# Patient Record
Sex: Female | Born: 1950 | Race: Black or African American | Hispanic: No | Smoking: Never smoker
Health system: Southern US, Community
[De-identification: ages and names within clinical notes are randomized; demographics above are authoritative.]

## PROBLEM LIST (undated history)

## (undated) DIAGNOSIS — E079 Disorder of thyroid, unspecified: Secondary | ICD-10-CM

## (undated) HISTORY — PX: CHOLECYSTECTOMY: SHX55

---

## 2014-12-16 ENCOUNTER — Emergency Department (HOSPITAL_BASED_OUTPATIENT_CLINIC_OR_DEPARTMENT_OTHER)
Admission: EM | Admit: 2014-12-16 | Discharge: 2014-12-16 | Disposition: A | Discharge status: Home / Self Care | Payer: BLUE CROSS/BLUE SHIELD | Attending: Emergency Medicine | Admitting: Emergency Medicine

## 2014-12-16 ENCOUNTER — Encounter (HOSPITAL_BASED_OUTPATIENT_CLINIC_OR_DEPARTMENT_OTHER): Payer: Self-pay

## 2014-12-16 ENCOUNTER — Emergency Department (HOSPITAL_BASED_OUTPATIENT_CLINIC_OR_DEPARTMENT_OTHER): Payer: BLUE CROSS/BLUE SHIELD

## 2014-12-16 DIAGNOSIS — Y9289 Other specified places as the place of occurrence of the external cause: Secondary | ICD-10-CM | POA: Diagnosis not present

## 2014-12-16 DIAGNOSIS — Z79899 Other long term (current) drug therapy: Secondary | ICD-10-CM | POA: Diagnosis not present

## 2014-12-16 DIAGNOSIS — M25461 Effusion, right knee: Secondary | ICD-10-CM | POA: Diagnosis not present

## 2014-12-16 DIAGNOSIS — Y9389 Activity, other specified: Secondary | ICD-10-CM | POA: Insufficient documentation

## 2014-12-16 DIAGNOSIS — S8991XA Unspecified injury of right lower leg, initial encounter: Secondary | ICD-10-CM | POA: Diagnosis present

## 2014-12-16 DIAGNOSIS — Y998 Other external cause status: Secondary | ICD-10-CM | POA: Insufficient documentation

## 2014-12-16 DIAGNOSIS — E079 Disorder of thyroid, unspecified: Secondary | ICD-10-CM | POA: Diagnosis not present

## 2014-12-16 DIAGNOSIS — M25561 Pain in right knee: Secondary | ICD-10-CM

## 2014-12-16 DIAGNOSIS — M25469 Effusion, unspecified knee: Secondary | ICD-10-CM

## 2014-12-16 DIAGNOSIS — W01198A Fall on same level from slipping, tripping and stumbling with subsequent striking against other object, initial encounter: Secondary | ICD-10-CM | POA: Insufficient documentation

## 2014-12-16 HISTORY — DX: Disorder of thyroid, unspecified: E07.9

## 2014-12-16 NOTE — ED Provider Notes (Signed)
CSN: [id number]     Arrival date & time 12/16/14  1611 History   First MD Initiated Contact with Patient 12/16/14 1622     Chief Complaint  Patient presents with  . Knee Pain     (Consider location/radiation/quality/duration/timing/severity/associated sxs/prior Treatment) Patient is a 64 y.o. female presenting with knee pain. The history is provided by the patient.  Knee Pain Location:  Knee Time since incident:  3 days Injury: yes   Mechanism of injury: fall   Fall:    Fall occurred:  Standing   Impact surface:  Product manager of impact:  Knees   Entrapped after fall: no   Knee location:  R knee Pain details:    Quality:  Aching   Radiates to:  Does not radiate   Severity:  Mild   Onset quality:  Gradual   Duration:  3 days   Timing:  Constant   Progression:  Unchanged Chronicity:  New Dislocation: no   Foreign body present:  No foreign bodies Relieved by:  NSAIDs Worsened by:  Nothing tried Ineffective treatments:  None tried Associated symptoms: swelling   Associated symptoms: no muscle weakness, no numbness and no tingling     Past Medical History  Diagnosis Date  . Thyroid disease    Past Surgical History  Procedure Laterality Date  . Cesarean section    . Cholecystectomy     No family history on file. Social History  Substance Use Topics  . Smoking status: Never Smoker   . Smokeless tobacco: None  . Alcohol Use: No   OB History    No data available     Review of Systems  All other systems reviewed and are negative.     Allergies  Review of patient's allergies indicates no known allergies.  Home Medications   Prior to Admission medications   Medication Sig Start Date End Date Taking? Authorizing Provider  Levothyroxine Sodium (SYNTHROID PO) Take by mouth.   Yes Historical Provider, MD   BP 140/78 mmHg  Pulse 95  Temp(Src) 99 F (37.2 C) (Oral)  Resp 18  Ht 5\' 4"  (1.626 m)  Wt 88.905 kg  BMI 33.63 kg/m2 Physical Exam   Constitutional: She is oriented to person, place, and time. She appears well-developed and well-nourished.  HENT:  Head: Atraumatic.  Eyes: Conjunctivae are normal. No scleral icterus.  Neck: No tracheal deviation present.  Cardiovascular:  Pulses:      Posterior tibial pulses are 2+ on the right side, and 2+ on the left side.  Pulmonary/Chest: Effort normal. No respiratory distress.  Musculoskeletal: Normal range of motion. She exhibits edema and tenderness.       Right wrist: Normal.       Left wrist: Normal.       Right knee: She exhibits swelling and effusion. She exhibits normal range of motion, no ecchymosis, no deformity, no laceration, no erythema, normal alignment, no LCL laxity and normal patellar mobility. Tenderness found. Medial joint line tenderness noted. No lateral joint line tenderness noted.       Left knee: Normal.  Compartments are soft and compressible.  Neurological: She is alert and oriented to person, place, and time.  Strength and sensation intact bilaterally in lower extremities.   Skin: Skin is warm and dry.  Psychiatric: She has a normal mood and affect. Her behavior is normal.    ED Course  Procedures (including critical care time) Labs Review Labs Reviewed - No data to display  Imaging  Review Dg Knee Complete 4 Views Right  12/16/2014  CLINICAL DATA:  Pain following fall EXAM: RIGHT KNEE - COMPLETE 4+ VIEW COMPARISON:  None. FINDINGS: Frontal, lateral, and bilateral oblique views were obtained. There is no demonstrable fracture or dislocation. There is a sizable joint effusion. There is osteoarthritic change with moderate narrowing of the patellofemoral joint and milder narrowing in the medial and lateral compartments. There is spurring in all compartments. No erosive change. IMPRESSION: Sizable joint effusion. 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 There is spurring in all compartments. No acute fracture or  dislocation. Electronically Signed   By: Lowella Grip III M.D.   On: 12/16/2014 17:17   I have personally reviewed and evaluated these images and lab results as part of my medical decision-making.   EKG Interpretation None      MDM   Final diagnoses:  Joint effusion of knee  Right knee pain    Patient presents with right knee pain after a fall from standing on Sunday.  No LOC or head injury.  VSS, NAD.  No numbness, tingling, or weakness.  On exam, patient ambulatory without difficulty.  Strength and sensation intact.  Intact distal pulses.  FAROM.  Will obtain plain films.  Plain films show evidence of joint effusion, osteoarthritic change with narrowing in patellofemoral joint region.  Spurring in all compartments.  No acute fracture or dislocation.  Evaluation does not show pathology requring ongoing emergent intervention or admission. Pt is hemodynamically stable and mentating appropriately. Discussed findings/results and plan with patient/guardian, who agrees with plan. All questions answered. Return precautions discussed and outpatient follow up given.      Gloriann Loan, PA-C 12/16/14 1806

## 2014-12-16 NOTE — Discharge Instructions (Signed)
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
## 2014-12-25 ENCOUNTER — Emergency Department (HOSPITAL_BASED_OUTPATIENT_CLINIC_OR_DEPARTMENT_OTHER): Payer: BLUE CROSS/BLUE SHIELD

## 2014-12-25 ENCOUNTER — Encounter (HOSPITAL_BASED_OUTPATIENT_CLINIC_OR_DEPARTMENT_OTHER): Payer: Self-pay | Admitting: *Deleted

## 2014-12-25 ENCOUNTER — Emergency Department (HOSPITAL_BASED_OUTPATIENT_CLINIC_OR_DEPARTMENT_OTHER)
Admission: EM | Admit: 2014-12-25 | Discharge: 2014-12-25 | Disposition: A | Discharge status: Home / Self Care | Payer: BLUE CROSS/BLUE SHIELD | Attending: Emergency Medicine | Admitting: Emergency Medicine

## 2014-12-25 DIAGNOSIS — S8991XA Unspecified injury of right lower leg, initial encounter: Secondary | ICD-10-CM | POA: Diagnosis not present

## 2014-12-25 DIAGNOSIS — S60012A Contusion of left thumb without damage to nail, initial encounter: Secondary | ICD-10-CM | POA: Insufficient documentation

## 2014-12-25 DIAGNOSIS — Y9389 Activity, other specified: Secondary | ICD-10-CM | POA: Insufficient documentation

## 2014-12-25 DIAGNOSIS — M79645 Pain in left finger(s): Secondary | ICD-10-CM

## 2014-12-25 DIAGNOSIS — S60011A Contusion of right thumb without damage to nail, initial encounter: Secondary | ICD-10-CM | POA: Diagnosis not present

## 2014-12-25 DIAGNOSIS — S8991XD Unspecified injury of right lower leg, subsequent encounter: Secondary | ICD-10-CM | POA: Diagnosis not present

## 2014-12-25 DIAGNOSIS — Y92481 Parking lot as the place of occurrence of the external cause: Secondary | ICD-10-CM | POA: Insufficient documentation

## 2014-12-25 DIAGNOSIS — E079 Disorder of thyroid, unspecified: Secondary | ICD-10-CM | POA: Insufficient documentation

## 2014-12-25 DIAGNOSIS — M79644 Pain in right finger(s): Secondary | ICD-10-CM

## 2014-12-25 DIAGNOSIS — Y998 Other external cause status: Secondary | ICD-10-CM | POA: Diagnosis not present

## 2014-12-25 DIAGNOSIS — M25561 Pain in right knee: Secondary | ICD-10-CM

## 2014-12-25 MED ORDER — TRAMADOL HCL 50 MG PO TABS: 50.0000 mg | ORAL_TABLET | Freq: Four times a day (QID) | ORAL | Status: AC | PRN

## 2014-12-25 NOTE — Discharge Instructions (Signed)
Take the prescribed medication as directed.  You may continue taking aleve/tylenol/motrin as needed. May want to ice and elevate knee at home to help with pain/swelling. Follow-up with a primary care physician in the area-- see resource guide to help with this. You may also follow-up with an orthopedist in the area-- i have given you 2 contacts for physicians or you may find your own. Return to the ED for new or worsening symptoms.   Emergency Department Resource Guide 1) Find a Doctor and Pay Out of Pocket Although you won't have to find out who is covered by your insurance plan, it is a good idea to ask around and get recommendations. You will then need to call the office and see if the doctor you have chosen will accept you as a new patient and what types of options they offer for patients who are self-pay. Some doctors offer discounts or will set up payment plans for their patients who do not have insurance, but you will need to ask so you aren't surprised when you get to your appointment.  2) Contact Your Local Health Department Not all health departments have doctors that can see patients for sick visits, but many do, so it is worth a call to see if yours does. If you don't know where your local health department is, you can check in your phone book. The CDC also has a tool to help you locate your state's health department, and many state websites also have listings of all of their local health departments.  3) Find a [facility] If your illness is not likely to be very severe or complicated, you may want to try a walk in clinic. These are popping up all over the country in pharmacies, drugstores, and shopping centers. They're usually staffed by nurse practitioners or physician assistants that have been trained to treat common illnesses and complaints. They're usually fairly quick and inexpensive. However, if you have serious medical issues or chronic medical problems, these are probably not  your best option.  No Primary Care Doctor: - Call Health Connect at  [phone] - they can help you locate a primary care doctor that  accepts your insurance, provides certain services, etc. - Physician Referral Service- [phone]  Chronic Pain Problems: Organization         Address  Phone   Notes  [facility]  [phone] Patients need to be referred by their primary care doctor.   Medication Assistance: Organization         Address  Phone   Notes  [facility] Medication [facility] [address] [place], [place] [address] [phone] --Must be a resident of [facility] -- Must have NO insurance coverage whatsoever (no Medicaid/ Medicare, etc.) -- The pt. MUST have a primary care doctor that directs their care regularly and follows them in the community   MedAssist  [phone]   [employer]  [phone]    Agencies that provide inexpensive medical care: Organization         Address  Phone   Notes  [place]  [phone]   [facility]    [phone]   [facility] [place], [place] [address] [phone]   [place] [address] [place] [phone]   Planned Parenthood    [phone]   [facility]    [phone]  Community Health and [place]  [address], [place] Phone:  [phone], Fax:  [phone] Hours of Operation:  9 am - 6 pm, M-F.  Also accepts Medicaid/Medicare and self-pay.  [facility] [place] [address], [address], [place] Phone: [phone], Fax: [phone]. Hours of Operation:  8:30 am - 5:30 pm, M-F.  Also accepts Medicaid and self-pay.  [facility] [place] [address], [place] Phone: [phone]   [place], [place], [place] [phone], Ext. [phone] Mondays & Thursdays: 7-9 AM.  First  15 patients are seen on a first come, first serve basis.    [place] Providers:  Organization         Address  Phone   Notes  [facility] [address], [place] [phone] Also accepts self-pay patients.  [facility] [id number] [place], [place]  [phone]   [place], [address], [place] [phone]   [facility] [address] [place] [phone]   Lucianne Lei [address], [place]   [phone] Only accepts [place] Access [place] patients after they have their name applied to their card.   Self-Pay (no insurance) in [facility]:  Organization         Address  Phone   Notes  Sickle Cell Patients, [facility] Internal Medicine [place] [phone]   [facility] [place] [phone]   [facility] [place]  [place], [place], [place] [phone]   [facility]/Dr. Osei-Bonsu  [address], [place] or [address], [place] [phone] Phone number for both [place] and [place] locations is the same.  Urgent Medical and [facility] [address], [place] [phone]   [facility] [address] [place] or [address] [phone]   [facility] [address], [place] [phone], phone; [phone], fax Sees patients 1st and 3rd Saturday of every month.  Must not qualify for public or private insurance (i.e. Medicaid, Medicare, [place] Health Choice, Veterans' Benefits)  Household income should be no more than 200% of the poverty level The clinic cannot treat you if you are pregnant or think you are pregnant  Sexually transmitted diseases are not treated at the clinic.    Dental  Care: Organization         Address  Phone  Notes  [facility] of [facility] [place] [phone] Accepts children up to age 70 who are enrolled in [place] or [place]; pregnant women with a Medicaid card; and children who have applied for Medicaid or [place] Health Choice, but were declined, whose parents can pay a reduced fee at time of service.  [facility] Department of [facility]  [address], [place] [phone] Accepts children up to age 69 who are enrolled in [place] or [place]; pregnant women with a Medicaid card; and children who have applied for Medicaid or [place] Health Choice, but were declined, whose parents can pay a reduced fee at time of service.  [place]  [phone]  [place] [phone] Patients are seen by appointment only. Walk-ins are not accepted. [place] will see patients 26 years of age and older. Monday - Tuesday (8am-5pm) Most Wednesdays (8:30-5pm) $30 per visit, cash only  [facility] Adult Dental Access PROGRAM  [address], [facility] [phone] Patients are seen by appointment only. Walk-ins are not accepted. [place] will see patients 62 years of age and older. One Wednesday Evening (Monthly: Volunteer Based).  $30 per visit, cash only  [place]  [phone] for adults; Children under age 39, call Graduate Pediatric Dentistry at [phone]. Children aged 75-14, please call [phone] to request a pediatric application.  Dental services are provided in all areas of dental care including fillings, crowns and bridges, complete and partial dentures, implants, gum treatment, root canals, and extractions. Preventive care is also provided. Treatment is provided to both adults and children. Patients are selected via a lottery and there is often a waiting list.   [facility] [address] [place]  [phone] [email or web address]   [facility] [address] [place], [place] [phone], Ext. [phone] Second and Fourth Thursday of each month, opens at 6:30 AM; Clinic ends at 9 AM.  Patients are seen on a first-come first-served basis, and a limited number are seen during each clinic.   [facility]  [address] Hillard Danker [place], [place] [phone]   Eligibility Requirements You must have lived in [place], [place], or [place] counties for at least the last three months.   You cannot be eligible for state or federal sponsored [employer], including [employer], [place], or [employer].   You generally cannot be eligible for healthcare insurance through your employer.    How to apply: Eligibility screenings are held every Tuesday and Wednesday afternoon from 1:00 pm until 4:00 pm. You do not need an appointment for the interview!  [facility] [address] [place], [place]   [place]  [place] Department  [place]  [phone]    Behavioral Health Resources in the Community: Intensive Outpatient Programs Organization         Address  Phone  Notes  [place]. [address] [place], [place] [phone]   [facility]patient [address] [place], [place]   ADS: Alcohol & Drug Svcs [address], [place], [place]   [place] [address]  [place], [place] or [phone]   Substance Abuse Resources Organization         Address  Phone  Notes  Alcohol and Drug Services  [phone]   [place]  [phone]   The [place]   Chinita Pester  [phone]   Residential & Outpatient Substance Abuse Program  [phone]    Psychological Services Organization         Address  Phone  Notes  [facility] [place]  [place]  [phone]   [place] [address] [place] or [phone]    Mobile Crisis Teams Organization         Address  Phone  Notes  [facility]rapeutic Alternatives, Mobile Crisis Care Unit  [phone]   Assertive Psychotherapeutic Services  [address] [place], [place]  [facility] [place] [address], [address] [place] [phone]    Self-Help/Support Groups Organization         Address  Phone             Notes  Mental Health Assoc. of [place] - variety of support groups  Jessie Call for more information  Narcotics Anonymous (NA), [facility]aring [facility] [address], [employer]  2 meetings at this location   Special educational needs teacher         Address  Phone  Notes  ASAP Residential Treatment [place],    [place]  1-3181652474   [facility]  [address], [place] [id number], [place], [place]   [place], [place] [phone] Admissions: 8am-3pm M-F  Incentives Substance [place] [address]    [place], [place] [phone]   [facility] [address] [place], [place], [place]   [facility] [address]  [place], [place]   Insight Programs - Intensive Outpatient [address] Kristeen [address], [place], [place]   [facility] [place].,  [place], [place] [phone] or [phone]   [facility]idential Treatment Services (RTS) [address] [place], [place] Accepts Medicaid  [facility] [place] [address]  [place] [phone] Substance Abuse/Addiction Treatment   [facility] Organization         Address  Phone  Notes  [facility]  [phone]   Domenic Schwab, PhD [address] [place], [place]   [phone] or [phone]   [place], [place] [phone]   [facility] [address] [place], [place] [phone] Insurance/Medicaid/sponsorship through [facility] and Families [address], [address] [place]                                    [place], [place] [phone] [place] [address], [place] [phone]    Dr. Adele Schilder  [phone]   [facility] Dept. 1) [address], [place] 2) [place] 3)  [address], [place] [phone]  [phone]   [place] [phone] or [phone] (After Hours)

## 2014-12-25 NOTE — ED Notes (Signed)
Per pt report [place], given knee immobilizer on 12/16/14, took aleve with no reduction in pain. Also noticed bruising on hands.

## 2014-12-25 NOTE — ED Notes (Signed)
Pt. Is able to use her hands and her thumbs bilat. With no difficulty and Pt. Drove herself to [place] ED.
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
## 2014-12-30 NOTE — ED Provider Notes (Signed)
Medical screening examination/treatment/procedure(s) were performed by non-physician practitioner and as supervising physician I was immediately available for consultation/collaboration.   EKG Interpretation None        Fredia Sorrow, MD 12/30/14 [phone]
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
## 2018-01-22 DIAGNOSIS — J4520 Mild intermittent asthma, uncomplicated: Secondary | ICD-10-CM | POA: Diagnosis not present

## 2018-02-05 DIAGNOSIS — B9789 Other viral agents as the cause of diseases classified elsewhere: Secondary | ICD-10-CM | POA: Diagnosis not present

## 2018-02-05 DIAGNOSIS — J069 Acute upper respiratory infection, unspecified: Secondary | ICD-10-CM | POA: Diagnosis not present

## 2018-02-05 DIAGNOSIS — R05 Cough: Secondary | ICD-10-CM | POA: Diagnosis not present

## 2018-02-08 DIAGNOSIS — J4531 Mild persistent asthma with (acute) exacerbation: Secondary | ICD-10-CM | POA: Diagnosis not present

## 2018-02-22 DIAGNOSIS — J4520 Mild intermittent asthma, uncomplicated: Secondary | ICD-10-CM | POA: Diagnosis not present

## 2018-03-23 DIAGNOSIS — J4520 Mild intermittent asthma, uncomplicated: Secondary | ICD-10-CM | POA: Diagnosis not present

## 2018-04-15 DIAGNOSIS — J301 Allergic rhinitis due to pollen: Secondary | ICD-10-CM | POA: Diagnosis not present

## 2018-04-15 DIAGNOSIS — J4520 Mild intermittent asthma, uncomplicated: Secondary | ICD-10-CM | POA: Diagnosis not present

## 2018-04-23 DIAGNOSIS — J4520 Mild intermittent asthma, uncomplicated: Secondary | ICD-10-CM | POA: Diagnosis not present

## 2018-05-21 DIAGNOSIS — J45909 Unspecified asthma, uncomplicated: Secondary | ICD-10-CM | POA: Diagnosis not present

## 2018-05-21 DIAGNOSIS — Z8249 Family history of ischemic heart disease and other diseases of the circulatory system: Secondary | ICD-10-CM | POA: Diagnosis not present

## 2018-05-21 DIAGNOSIS — E039 Hypothyroidism, unspecified: Secondary | ICD-10-CM | POA: Diagnosis not present

## 2018-05-21 DIAGNOSIS — Z809 Family history of malignant neoplasm, unspecified: Secondary | ICD-10-CM | POA: Diagnosis not present

## 2018-05-21 DIAGNOSIS — Z7951 Long term (current) use of inhaled steroids: Secondary | ICD-10-CM | POA: Diagnosis not present

## 2018-05-21 DIAGNOSIS — Z841 Family history of disorders of kidney and ureter: Secondary | ICD-10-CM | POA: Diagnosis not present

## 2018-05-21 DIAGNOSIS — M170 Bilateral primary osteoarthritis of knee: Secondary | ICD-10-CM | POA: Diagnosis not present

## 2018-05-23 DIAGNOSIS — J4520 Mild intermittent asthma, uncomplicated: Secondary | ICD-10-CM | POA: Diagnosis not present

## 2018-06-10 DIAGNOSIS — J4520 Mild intermittent asthma, uncomplicated: Secondary | ICD-10-CM | POA: Diagnosis not present

## 2018-06-13 DIAGNOSIS — Z1231 Encounter for screening mammogram for malignant neoplasm of breast: Secondary | ICD-10-CM | POA: Diagnosis not present

## 2018-06-23 DIAGNOSIS — J4520 Mild intermittent asthma, uncomplicated: Secondary | ICD-10-CM | POA: Diagnosis not present

## 2018-07-05 DIAGNOSIS — Z719 Counseling, unspecified: Secondary | ICD-10-CM | POA: Diagnosis not present

## 2018-07-11 DIAGNOSIS — R05 Cough: Secondary | ICD-10-CM | POA: Diagnosis not present

## 2018-07-11 DIAGNOSIS — J4531 Mild persistent asthma with (acute) exacerbation: Secondary | ICD-10-CM | POA: Diagnosis not present

## 2018-07-23 DIAGNOSIS — J4520 Mild intermittent asthma, uncomplicated: Secondary | ICD-10-CM | POA: Diagnosis not present

## 2018-08-23 DIAGNOSIS — J4520 Mild intermittent asthma, uncomplicated: Secondary | ICD-10-CM | POA: Diagnosis not present

## 2018-09-13 DIAGNOSIS — E559 Vitamin D deficiency, unspecified: Secondary | ICD-10-CM | POA: Diagnosis not present

## 2018-09-13 DIAGNOSIS — J4530 Mild persistent asthma, uncomplicated: Secondary | ICD-10-CM | POA: Diagnosis not present

## 2018-09-13 DIAGNOSIS — R05 Cough: Secondary | ICD-10-CM | POA: Diagnosis not present

## 2018-09-13 DIAGNOSIS — Z131 Encounter for screening for diabetes mellitus: Secondary | ICD-10-CM | POA: Diagnosis not present

## 2018-09-13 DIAGNOSIS — E669 Obesity, unspecified: Secondary | ICD-10-CM | POA: Diagnosis not present

## 2018-09-13 DIAGNOSIS — E7800 Pure hypercholesterolemia, unspecified: Secondary | ICD-10-CM | POA: Diagnosis not present

## 2018-09-13 DIAGNOSIS — Z0001 Encounter for general adult medical examination with abnormal findings: Secondary | ICD-10-CM | POA: Diagnosis not present

## 2018-09-13 DIAGNOSIS — E039 Hypothyroidism, unspecified: Secondary | ICD-10-CM | POA: Diagnosis not present

## 2018-09-23 DIAGNOSIS — J4520 Mild intermittent asthma, uncomplicated: Secondary | ICD-10-CM | POA: Diagnosis not present

## 2018-09-27 DIAGNOSIS — E1165 Type 2 diabetes mellitus with hyperglycemia: Secondary | ICD-10-CM | POA: Diagnosis not present

## 2018-09-27 DIAGNOSIS — E669 Obesity, unspecified: Secondary | ICD-10-CM | POA: Diagnosis not present

## 2018-09-27 DIAGNOSIS — E559 Vitamin D deficiency, unspecified: Secondary | ICD-10-CM | POA: Diagnosis not present

## 2018-09-27 DIAGNOSIS — J4530 Mild persistent asthma, uncomplicated: Secondary | ICD-10-CM | POA: Diagnosis not present

## 2018-09-27 DIAGNOSIS — E039 Hypothyroidism, unspecified: Secondary | ICD-10-CM | POA: Diagnosis not present

## 2018-09-27 DIAGNOSIS — E7800 Pure hypercholesterolemia, unspecified: Secondary | ICD-10-CM | POA: Diagnosis not present

## 2018-10-03 DIAGNOSIS — R69 Illness, unspecified: Secondary | ICD-10-CM | POA: Diagnosis not present

## 2018-10-23 DIAGNOSIS — J4520 Mild intermittent asthma, uncomplicated: Secondary | ICD-10-CM | POA: Diagnosis not present

## 2018-10-29 DIAGNOSIS — M216X2 Other acquired deformities of left foot: Secondary | ICD-10-CM | POA: Diagnosis not present

## 2018-10-29 DIAGNOSIS — M7662 Achilles tendinitis, left leg: Secondary | ICD-10-CM | POA: Diagnosis not present

## 2018-11-06 DIAGNOSIS — E1165 Type 2 diabetes mellitus with hyperglycemia: Secondary | ICD-10-CM | POA: Diagnosis not present

## 2018-11-06 DIAGNOSIS — R05 Cough: Secondary | ICD-10-CM | POA: Diagnosis not present

## 2018-11-23 DIAGNOSIS — J4520 Mild intermittent asthma, uncomplicated: Secondary | ICD-10-CM | POA: Diagnosis not present

## 2018-11-26 DIAGNOSIS — J4520 Mild intermittent asthma, uncomplicated: Secondary | ICD-10-CM | POA: Diagnosis not present

## 2018-12-02 DIAGNOSIS — J4530 Mild persistent asthma, uncomplicated: Secondary | ICD-10-CM | POA: Diagnosis not present

## 2018-12-02 DIAGNOSIS — E669 Obesity, unspecified: Secondary | ICD-10-CM | POA: Diagnosis not present

## 2018-12-02 DIAGNOSIS — J218 Acute bronchiolitis due to other specified organisms: Secondary | ICD-10-CM | POA: Diagnosis not present

## 2018-12-02 DIAGNOSIS — E1165 Type 2 diabetes mellitus with hyperglycemia: Secondary | ICD-10-CM | POA: Diagnosis not present

## 2018-12-02 DIAGNOSIS — E559 Vitamin D deficiency, unspecified: Secondary | ICD-10-CM | POA: Diagnosis not present

## 2018-12-02 DIAGNOSIS — E039 Hypothyroidism, unspecified: Secondary | ICD-10-CM | POA: Diagnosis not present

## 2018-12-02 DIAGNOSIS — E7800 Pure hypercholesterolemia, unspecified: Secondary | ICD-10-CM | POA: Diagnosis not present

## 2018-12-18 DIAGNOSIS — E039 Hypothyroidism, unspecified: Secondary | ICD-10-CM | POA: Diagnosis not present

## 2018-12-18 DIAGNOSIS — E1165 Type 2 diabetes mellitus with hyperglycemia: Secondary | ICD-10-CM | POA: Diagnosis not present

## 2018-12-18 DIAGNOSIS — E559 Vitamin D deficiency, unspecified: Secondary | ICD-10-CM | POA: Diagnosis not present

## 2018-12-18 DIAGNOSIS — E7800 Pure hypercholesterolemia, unspecified: Secondary | ICD-10-CM | POA: Diagnosis not present

## 2018-12-18 DIAGNOSIS — J4530 Mild persistent asthma, uncomplicated: Secondary | ICD-10-CM | POA: Diagnosis not present

## 2018-12-18 DIAGNOSIS — E669 Obesity, unspecified: Secondary | ICD-10-CM | POA: Diagnosis not present

## 2018-12-18 DIAGNOSIS — R05 Cough: Secondary | ICD-10-CM | POA: Diagnosis not present

## 2018-12-23 DIAGNOSIS — J4520 Mild intermittent asthma, uncomplicated: Secondary | ICD-10-CM | POA: Diagnosis not present

## 2018-12-26 DIAGNOSIS — R0602 Shortness of breath: Secondary | ICD-10-CM | POA: Diagnosis not present

## 2018-12-26 DIAGNOSIS — J4541 Moderate persistent asthma with (acute) exacerbation: Secondary | ICD-10-CM | POA: Diagnosis not present

## 2018-12-26 DIAGNOSIS — E669 Obesity, unspecified: Secondary | ICD-10-CM | POA: Diagnosis not present

## 2018-12-26 DIAGNOSIS — R05 Cough: Secondary | ICD-10-CM | POA: Diagnosis not present

## 2019-01-18 DIAGNOSIS — R69 Illness, unspecified: Secondary | ICD-10-CM | POA: Diagnosis not present

## 2019-01-23 DIAGNOSIS — J4520 Mild intermittent asthma, uncomplicated: Secondary | ICD-10-CM | POA: Diagnosis not present

## 2019-03-11 DIAGNOSIS — J4540 Moderate persistent asthma, uncomplicated: Secondary | ICD-10-CM | POA: Diagnosis not present

## 2019-03-11 DIAGNOSIS — E669 Obesity, unspecified: Secondary | ICD-10-CM | POA: Diagnosis not present

## 2019-03-21 DIAGNOSIS — M545 Low back pain: Secondary | ICD-10-CM | POA: Diagnosis not present

## 2019-03-21 DIAGNOSIS — E7800 Pure hypercholesterolemia, unspecified: Secondary | ICD-10-CM | POA: Diagnosis not present

## 2019-03-21 DIAGNOSIS — E039 Hypothyroidism, unspecified: Secondary | ICD-10-CM | POA: Diagnosis not present

## 2019-03-21 DIAGNOSIS — E1165 Type 2 diabetes mellitus with hyperglycemia: Secondary | ICD-10-CM | POA: Diagnosis not present

## 2019-03-21 DIAGNOSIS — J4530 Mild persistent asthma, uncomplicated: Secondary | ICD-10-CM | POA: Diagnosis not present

## 2019-03-21 DIAGNOSIS — E669 Obesity, unspecified: Secondary | ICD-10-CM | POA: Diagnosis not present

## 2019-03-21 DIAGNOSIS — E559 Vitamin D deficiency, unspecified: Secondary | ICD-10-CM | POA: Diagnosis not present

## 2019-05-06 DIAGNOSIS — R69 Illness, unspecified: Secondary | ICD-10-CM | POA: Diagnosis not present

## 2019-05-27 DIAGNOSIS — J028 Acute pharyngitis due to other specified organisms: Secondary | ICD-10-CM | POA: Diagnosis not present

## 2019-05-27 DIAGNOSIS — E559 Vitamin D deficiency, unspecified: Secondary | ICD-10-CM | POA: Diagnosis not present

## 2019-05-27 DIAGNOSIS — E1165 Type 2 diabetes mellitus with hyperglycemia: Secondary | ICD-10-CM | POA: Diagnosis not present

## 2019-05-27 DIAGNOSIS — E039 Hypothyroidism, unspecified: Secondary | ICD-10-CM | POA: Diagnosis not present

## 2019-05-27 DIAGNOSIS — J4530 Mild persistent asthma, uncomplicated: Secondary | ICD-10-CM | POA: Diagnosis not present

## 2019-05-27 DIAGNOSIS — E7800 Pure hypercholesterolemia, unspecified: Secondary | ICD-10-CM | POA: Diagnosis not present

## 2019-05-27 DIAGNOSIS — E669 Obesity, unspecified: Secondary | ICD-10-CM | POA: Diagnosis not present

## 2019-06-04 DIAGNOSIS — R69 Illness, unspecified: Secondary | ICD-10-CM | POA: Diagnosis not present

## 2019-06-20 DIAGNOSIS — Z1231 Encounter for screening mammogram for malignant neoplasm of breast: Secondary | ICD-10-CM | POA: Diagnosis not present

## 2019-06-26 DIAGNOSIS — E669 Obesity, unspecified: Secondary | ICD-10-CM | POA: Diagnosis not present

## 2019-06-26 DIAGNOSIS — J4530 Mild persistent asthma, uncomplicated: Secondary | ICD-10-CM | POA: Diagnosis not present

## 2019-06-26 DIAGNOSIS — E559 Vitamin D deficiency, unspecified: Secondary | ICD-10-CM | POA: Diagnosis not present

## 2019-06-26 DIAGNOSIS — E7800 Pure hypercholesterolemia, unspecified: Secondary | ICD-10-CM | POA: Diagnosis not present

## 2019-06-26 DIAGNOSIS — E1165 Type 2 diabetes mellitus with hyperglycemia: Secondary | ICD-10-CM | POA: Diagnosis not present

## 2019-06-26 DIAGNOSIS — E039 Hypothyroidism, unspecified: Secondary | ICD-10-CM | POA: Diagnosis not present

## 2019-06-27 DIAGNOSIS — H524 Presbyopia: Secondary | ICD-10-CM | POA: Diagnosis not present

## 2019-06-27 DIAGNOSIS — H52223 Regular astigmatism, bilateral: Secondary | ICD-10-CM | POA: Diagnosis not present

## 2019-07-08 DIAGNOSIS — R69 Illness, unspecified: Secondary | ICD-10-CM | POA: Diagnosis not present

## 2019-07-11 DIAGNOSIS — R69 Illness, unspecified: Secondary | ICD-10-CM | POA: Diagnosis not present

## 2019-08-20 DIAGNOSIS — R69 Illness, unspecified: Secondary | ICD-10-CM | POA: Diagnosis not present

## 2019-08-23 DIAGNOSIS — J4520 Mild intermittent asthma, uncomplicated: Secondary | ICD-10-CM | POA: Diagnosis not present

## 2019-09-11 DIAGNOSIS — E669 Obesity, unspecified: Secondary | ICD-10-CM | POA: Diagnosis not present

## 2019-09-11 DIAGNOSIS — R69 Illness, unspecified: Secondary | ICD-10-CM | POA: Diagnosis not present

## 2019-09-11 DIAGNOSIS — J4540 Moderate persistent asthma, uncomplicated: Secondary | ICD-10-CM | POA: Diagnosis not present

## 2019-09-18 DIAGNOSIS — R69 Illness, unspecified: Secondary | ICD-10-CM | POA: Diagnosis not present

## 2019-09-22 DIAGNOSIS — E559 Vitamin D deficiency, unspecified: Secondary | ICD-10-CM | POA: Diagnosis not present

## 2019-09-22 DIAGNOSIS — E1165 Type 2 diabetes mellitus with hyperglycemia: Secondary | ICD-10-CM | POA: Diagnosis not present

## 2019-09-22 DIAGNOSIS — E669 Obesity, unspecified: Secondary | ICD-10-CM | POA: Diagnosis not present

## 2019-09-22 DIAGNOSIS — J4530 Mild persistent asthma, uncomplicated: Secondary | ICD-10-CM | POA: Diagnosis not present

## 2019-09-22 DIAGNOSIS — Z0001 Encounter for general adult medical examination with abnormal findings: Secondary | ICD-10-CM | POA: Diagnosis not present

## 2019-09-22 DIAGNOSIS — E039 Hypothyroidism, unspecified: Secondary | ICD-10-CM | POA: Diagnosis not present

## 2019-09-22 DIAGNOSIS — E7800 Pure hypercholesterolemia, unspecified: Secondary | ICD-10-CM | POA: Diagnosis not present

## 2019-09-28 DIAGNOSIS — R69 Illness, unspecified: Secondary | ICD-10-CM | POA: Diagnosis not present

## 2019-10-17 DIAGNOSIS — R69 Illness, unspecified: Secondary | ICD-10-CM | POA: Diagnosis not present

## 2019-11-07 DIAGNOSIS — R69 Illness, unspecified: Secondary | ICD-10-CM | POA: Diagnosis not present

## 2019-11-10 ENCOUNTER — Encounter: Payer: Medicare HMO | Attending: Internal Medicine | Admitting: Registered"

## 2019-11-10 ENCOUNTER — Encounter: Payer: Self-pay | Admitting: Registered"

## 2019-11-10 ENCOUNTER — Other Ambulatory Visit: Payer: Self-pay

## 2019-11-10 DIAGNOSIS — E119 Type 2 diabetes mellitus without complications: Secondary | ICD-10-CM | POA: Diagnosis not present

## 2019-11-10 NOTE — Progress Notes (Signed)
Diabetes Self-Management Education  Visit Type: First/Initial  Appt. Start Time: 1405 Appt. End Time: 1500  11/10/2019  [name], identified by name and date of birth, is a 69 y.o. female with a diagnosis of Diabetes: Type 2.   ASSESSMENT  There were no vitals taken for this visit. There is no height or weight on file to calculate BMI.  Patient states she was a little confused about the diagnosis because she is not on diabetes medication and one doctor told her she was pre-diabetic and another provider told her she has diabetes. Patient states she would take metformin if her doctor recommends it.  Pt states after finding out about the high blood sugar she stopped drinking lemonade but still drinks her 6 oz juice combination, but not as often.  Pt states she stays active and goes to the [facility] regularly. 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   Diabetes Self-Management Education - 11/10/19 1411      Visit Information   Visit Type First/Initial      Initial Visit   Diabetes Type Type 2    Are you currently following a meal plan? Yes    What type of meal plan do you follow? avoiding sugar    Are you taking your medications as prescribed? Not on Medications      Health Coping   How would you rate your overall health? Good      Psychosocial Assessment   Patient Belief/Attitude about Diabetes Other (comment)   didn't know she had diabetes   How often do you need to have someone help you when you read instructions, pamphlets, or other written materials from your doctor or pharmacy? 1 - Never    What is the last grade level you completed in school? college      Complications   Last HgB A1C per patient/outside source 6.5 %    How often do you check your blood sugar? 1-2 times/day    Fasting Blood glucose range (mg/dL) 70-129   94-120   Have you had a dilated eye exam in the past 12 months? Yes    Have you had a dental exam in the past 12 months? Yes    Are you  checking your feet? Yes    How many days per week are you checking your feet? 5      Dietary Intake   Breakfast 100% wheat toast, raw honey or cream cheese and boiled egg nad sometimes [place] link    Snack (morning) fruit    Lunch yogurt, pecans, berries    Snack (afternoon) fruit    Dinner pork chops, green beans, corn    Snack (evening) none or pb crackers    Beverage(s) water,      Exercise   Exercise Type Light (walking / raking leaves)    How many days per week to you exercise? 3    How many minutes per day do you exercise? 45    Total minutes per week of exercise 135      Patient Education   Previous Diabetes Education No    Disease state  Definition of diabetes, type 1 and 2, and the diagnosis of diabetes    Nutrition management  Role of diet in the treatment of diabetes and the relationship between the three main macronutrients and blood glucose level;Carbohydrate counting    Physical activity and exercise  Role of exercise on diabetes management, blood pressure control and cardiac health.  Monitoring Identified appropriate SMBG and/or A1C goals.    Chronic complications Retinopathy and reason for yearly dilated eye exams    Psychosocial adjustment Other (comment)   sleep     Outcomes   Expected Outcomes Demonstrated interest in learning. Expect positive outcomes    Future DMSE PRN    Program Status Completed           Individualized Plan for Diabetes Self-Management Training:   Learning Objective:  Patient will have a greater understanding of diabetes self-management. Patient education plan is to attend individual and/or group sessions per assessed needs and concerns.    Patient Instructions  Continue eating a variety of fruits and vegetables daily. Consider adding one more day per week of structured exercise. Consider increasing your water intake. Aim for 2-3 Carb Choices per meal (30-45 grams) Aim for 0-1 Carbs per snack if hungry  Include protein in  moderation with your meals and snacks Consider reading food labels for Total Carbohydrate of foods Consider checking BG at alternate times per day as directed by MD or for decisions when changing diet Consider taking medication if directed by MD  Expected Outcomes:  Demonstrated interest in learning. Expect positive outcomes  Education material provided: ADA - How to Thrive: A Guide for Your Journey with Diabetes, A1C conversion sheet and Carbohydrate counting sheet  If problems or questions, patient to contact team via:  Phone and Email  Future DSME appointment: PRN

## 2019-11-10 NOTE — Patient Instructions (Addendum)
Continue eating a variety of fruits and vegetables daily. Consider adding one more day per week of structured exercise. Consider increasing your water intake. Aim for 2-3 Carb Choices per meal (30-45 grams) Aim for 0-1 Carbs per snack if hungry  Include protein in moderation with your meals and snacks Consider reading food labels for Total Carbohydrate of foods Consider checking BG at alternate times per day as directed by MD or for decisions when changing diet Consider taking medication if directed by MD

## 2019-11-24 DIAGNOSIS — E039 Hypothyroidism, unspecified: Secondary | ICD-10-CM | POA: Diagnosis not present

## 2019-11-24 DIAGNOSIS — E669 Obesity, unspecified: Secondary | ICD-10-CM | POA: Diagnosis not present

## 2019-11-24 DIAGNOSIS — J4530 Mild persistent asthma, uncomplicated: Secondary | ICD-10-CM | POA: Diagnosis not present

## 2019-11-24 DIAGNOSIS — E559 Vitamin D deficiency, unspecified: Secondary | ICD-10-CM | POA: Diagnosis not present

## 2019-11-24 DIAGNOSIS — E7800 Pure hypercholesterolemia, unspecified: Secondary | ICD-10-CM | POA: Diagnosis not present

## 2019-11-24 DIAGNOSIS — E1165 Type 2 diabetes mellitus with hyperglycemia: Secondary | ICD-10-CM | POA: Diagnosis not present

## 2019-12-10 DIAGNOSIS — E669 Obesity, unspecified: Secondary | ICD-10-CM | POA: Diagnosis not present

## 2019-12-10 DIAGNOSIS — J4530 Mild persistent asthma, uncomplicated: Secondary | ICD-10-CM | POA: Diagnosis not present

## 2019-12-10 DIAGNOSIS — E7800 Pure hypercholesterolemia, unspecified: Secondary | ICD-10-CM | POA: Diagnosis not present

## 2019-12-10 DIAGNOSIS — E1165 Type 2 diabetes mellitus with hyperglycemia: Secondary | ICD-10-CM | POA: Diagnosis not present

## 2019-12-10 DIAGNOSIS — E039 Hypothyroidism, unspecified: Secondary | ICD-10-CM | POA: Diagnosis not present

## 2019-12-10 DIAGNOSIS — E559 Vitamin D deficiency, unspecified: Secondary | ICD-10-CM | POA: Diagnosis not present

## 2019-12-22 DIAGNOSIS — E1165 Type 2 diabetes mellitus with hyperglycemia: Secondary | ICD-10-CM | POA: Diagnosis not present

## 2019-12-22 DIAGNOSIS — R69 Illness, unspecified: Secondary | ICD-10-CM | POA: Diagnosis not present

## 2019-12-22 DIAGNOSIS — E7800 Pure hypercholesterolemia, unspecified: Secondary | ICD-10-CM | POA: Diagnosis not present

## 2019-12-22 DIAGNOSIS — J4530 Mild persistent asthma, uncomplicated: Secondary | ICD-10-CM | POA: Diagnosis not present

## 2019-12-22 DIAGNOSIS — E669 Obesity, unspecified: Secondary | ICD-10-CM | POA: Diagnosis not present

## 2019-12-22 DIAGNOSIS — E559 Vitamin D deficiency, unspecified: Secondary | ICD-10-CM | POA: Diagnosis not present

## 2019-12-22 DIAGNOSIS — E039 Hypothyroidism, unspecified: Secondary | ICD-10-CM | POA: Diagnosis not present

## 2020-02-24 DIAGNOSIS — H2513 Age-related nuclear cataract, bilateral: Secondary | ICD-10-CM | POA: Diagnosis not present

## 2020-02-24 DIAGNOSIS — Z0100 Encounter for examination of eyes and vision without abnormal findings: Secondary | ICD-10-CM | POA: Diagnosis not present

## 2020-03-03 DIAGNOSIS — J4530 Mild persistent asthma, uncomplicated: Secondary | ICD-10-CM | POA: Diagnosis not present

## 2020-03-03 DIAGNOSIS — E039 Hypothyroidism, unspecified: Secondary | ICD-10-CM | POA: Diagnosis not present

## 2020-03-03 DIAGNOSIS — E7800 Pure hypercholesterolemia, unspecified: Secondary | ICD-10-CM | POA: Diagnosis not present

## 2020-03-03 DIAGNOSIS — E1165 Type 2 diabetes mellitus with hyperglycemia: Secondary | ICD-10-CM | POA: Diagnosis not present

## 2020-03-03 DIAGNOSIS — E559 Vitamin D deficiency, unspecified: Secondary | ICD-10-CM | POA: Diagnosis not present

## 2020-03-03 DIAGNOSIS — E669 Obesity, unspecified: Secondary | ICD-10-CM | POA: Diagnosis not present

## 2020-03-09 DIAGNOSIS — E7800 Pure hypercholesterolemia, unspecified: Secondary | ICD-10-CM | POA: Diagnosis not present

## 2020-03-30 DIAGNOSIS — M1711 Unilateral primary osteoarthritis, right knee: Secondary | ICD-10-CM | POA: Diagnosis not present

## 2020-03-30 DIAGNOSIS — M25561 Pain in right knee: Secondary | ICD-10-CM | POA: Diagnosis not present

## 2020-05-17 DIAGNOSIS — E559 Vitamin D deficiency, unspecified: Secondary | ICD-10-CM | POA: Diagnosis not present

## 2020-05-17 DIAGNOSIS — E1165 Type 2 diabetes mellitus with hyperglycemia: Secondary | ICD-10-CM | POA: Diagnosis not present

## 2020-05-17 DIAGNOSIS — J4530 Mild persistent asthma, uncomplicated: Secondary | ICD-10-CM | POA: Diagnosis not present

## 2020-05-17 DIAGNOSIS — E669 Obesity, unspecified: Secondary | ICD-10-CM | POA: Diagnosis not present

## 2020-05-17 DIAGNOSIS — E039 Hypothyroidism, unspecified: Secondary | ICD-10-CM | POA: Diagnosis not present

## 2020-05-17 DIAGNOSIS — E7800 Pure hypercholesterolemia, unspecified: Secondary | ICD-10-CM | POA: Diagnosis not present

## 2020-07-16 DIAGNOSIS — D124 Benign neoplasm of descending colon: Secondary | ICD-10-CM | POA: Diagnosis not present

## 2020-07-16 DIAGNOSIS — K5730 Diverticulosis of large intestine without perforation or abscess without bleeding: Secondary | ICD-10-CM | POA: Diagnosis not present

## 2020-07-16 DIAGNOSIS — D125 Benign neoplasm of sigmoid colon: Secondary | ICD-10-CM | POA: Diagnosis not present

## 2020-07-16 DIAGNOSIS — Z800 Family history of malignant neoplasm of digestive organs: Secondary | ICD-10-CM | POA: Diagnosis not present

## 2020-07-16 DIAGNOSIS — Z86010 Personal history of colonic polyps: Secondary | ICD-10-CM | POA: Diagnosis not present

## 2020-07-16 DIAGNOSIS — K635 Polyp of colon: Secondary | ICD-10-CM | POA: Diagnosis not present

## 2020-07-16 DIAGNOSIS — K640 First degree hemorrhoids: Secondary | ICD-10-CM | POA: Diagnosis not present

## 2020-07-16 DIAGNOSIS — D120 Benign neoplasm of cecum: Secondary | ICD-10-CM | POA: Diagnosis not present

## 2020-07-16 DIAGNOSIS — Z1211 Encounter for screening for malignant neoplasm of colon: Secondary | ICD-10-CM | POA: Diagnosis not present

## 2020-07-27 DIAGNOSIS — E669 Obesity, unspecified: Secondary | ICD-10-CM | POA: Diagnosis not present

## 2020-07-27 DIAGNOSIS — J45909 Unspecified asthma, uncomplicated: Secondary | ICD-10-CM | POA: Diagnosis not present

## 2020-07-27 DIAGNOSIS — E559 Vitamin D deficiency, unspecified: Secondary | ICD-10-CM | POA: Diagnosis not present

## 2020-07-27 DIAGNOSIS — M1710 Unilateral primary osteoarthritis, unspecified knee: Secondary | ICD-10-CM | POA: Diagnosis not present

## 2020-07-27 DIAGNOSIS — J4530 Mild persistent asthma, uncomplicated: Secondary | ICD-10-CM | POA: Diagnosis not present

## 2020-07-27 DIAGNOSIS — E7800 Pure hypercholesterolemia, unspecified: Secondary | ICD-10-CM | POA: Diagnosis not present

## 2020-07-27 DIAGNOSIS — E039 Hypothyroidism, unspecified: Secondary | ICD-10-CM | POA: Diagnosis not present

## 2020-07-27 DIAGNOSIS — E785 Hyperlipidemia, unspecified: Secondary | ICD-10-CM | POA: Diagnosis not present

## 2020-07-27 DIAGNOSIS — E1165 Type 2 diabetes mellitus with hyperglycemia: Secondary | ICD-10-CM | POA: Diagnosis not present

## 2020-08-17 DIAGNOSIS — M545 Low back pain, unspecified: Secondary | ICD-10-CM | POA: Diagnosis not present

## 2020-09-21 DIAGNOSIS — E785 Hyperlipidemia, unspecified: Secondary | ICD-10-CM | POA: Diagnosis not present

## 2020-09-21 DIAGNOSIS — J4530 Mild persistent asthma, uncomplicated: Secondary | ICD-10-CM | POA: Diagnosis not present

## 2020-09-21 DIAGNOSIS — E669 Obesity, unspecified: Secondary | ICD-10-CM | POA: Diagnosis not present

## 2020-09-21 DIAGNOSIS — E1165 Type 2 diabetes mellitus with hyperglycemia: Secondary | ICD-10-CM | POA: Diagnosis not present

## 2020-09-21 DIAGNOSIS — M1710 Unilateral primary osteoarthritis, unspecified knee: Secondary | ICD-10-CM | POA: Diagnosis not present

## 2020-09-21 DIAGNOSIS — E039 Hypothyroidism, unspecified: Secondary | ICD-10-CM | POA: Diagnosis not present

## 2020-09-21 DIAGNOSIS — E7800 Pure hypercholesterolemia, unspecified: Secondary | ICD-10-CM | POA: Diagnosis not present

## 2020-09-21 DIAGNOSIS — J45909 Unspecified asthma, uncomplicated: Secondary | ICD-10-CM | POA: Diagnosis not present

## 2020-09-21 DIAGNOSIS — E559 Vitamin D deficiency, unspecified: Secondary | ICD-10-CM | POA: Diagnosis not present

## 2020-09-27 DIAGNOSIS — E039 Hypothyroidism, unspecified: Secondary | ICD-10-CM | POA: Diagnosis not present

## 2020-09-27 DIAGNOSIS — E1165 Type 2 diabetes mellitus with hyperglycemia: Secondary | ICD-10-CM | POA: Diagnosis not present

## 2020-09-27 DIAGNOSIS — E669 Obesity, unspecified: Secondary | ICD-10-CM | POA: Diagnosis not present

## 2020-09-27 DIAGNOSIS — Z0001 Encounter for general adult medical examination with abnormal findings: Secondary | ICD-10-CM | POA: Diagnosis not present

## 2020-09-27 DIAGNOSIS — J4530 Mild persistent asthma, uncomplicated: Secondary | ICD-10-CM | POA: Diagnosis not present

## 2020-09-27 DIAGNOSIS — E7800 Pure hypercholesterolemia, unspecified: Secondary | ICD-10-CM | POA: Diagnosis not present

## 2020-09-27 DIAGNOSIS — E559 Vitamin D deficiency, unspecified: Secondary | ICD-10-CM | POA: Diagnosis not present

## 2020-10-29 DIAGNOSIS — J4540 Moderate persistent asthma, uncomplicated: Secondary | ICD-10-CM | POA: Diagnosis not present

## 2020-12-27 DIAGNOSIS — B349 Viral infection, unspecified: Secondary | ICD-10-CM | POA: Diagnosis not present

## 2020-12-27 DIAGNOSIS — J22 Unspecified acute lower respiratory infection: Secondary | ICD-10-CM | POA: Diagnosis not present

## 2020-12-27 DIAGNOSIS — E559 Vitamin D deficiency, unspecified: Secondary | ICD-10-CM | POA: Diagnosis not present

## 2020-12-27 DIAGNOSIS — J4530 Mild persistent asthma, uncomplicated: Secondary | ICD-10-CM | POA: Diagnosis not present

## 2020-12-27 DIAGNOSIS — E039 Hypothyroidism, unspecified: Secondary | ICD-10-CM | POA: Diagnosis not present

## 2020-12-27 DIAGNOSIS — E1165 Type 2 diabetes mellitus with hyperglycemia: Secondary | ICD-10-CM | POA: Diagnosis not present

## 2020-12-27 DIAGNOSIS — E7800 Pure hypercholesterolemia, unspecified: Secondary | ICD-10-CM | POA: Diagnosis not present

## 2021-03-01 DIAGNOSIS — H2513 Age-related nuclear cataract, bilateral: Secondary | ICD-10-CM | POA: Diagnosis not present

## 2021-03-01 DIAGNOSIS — H31103 Choroidal degeneration, unspecified, bilateral: Secondary | ICD-10-CM | POA: Diagnosis not present

## 2021-03-01 DIAGNOSIS — H35363 Drusen (degenerative) of macula, bilateral: Secondary | ICD-10-CM | POA: Diagnosis not present

## 2021-04-04 DIAGNOSIS — E039 Hypothyroidism, unspecified: Secondary | ICD-10-CM | POA: Diagnosis not present

## 2021-04-04 DIAGNOSIS — E7800 Pure hypercholesterolemia, unspecified: Secondary | ICD-10-CM | POA: Diagnosis not present

## 2021-04-04 DIAGNOSIS — J302 Other seasonal allergic rhinitis: Secondary | ICD-10-CM | POA: Diagnosis not present

## 2021-04-04 DIAGNOSIS — J4530 Mild persistent asthma, uncomplicated: Secondary | ICD-10-CM | POA: Diagnosis not present

## 2021-04-04 DIAGNOSIS — E559 Vitamin D deficiency, unspecified: Secondary | ICD-10-CM | POA: Diagnosis not present

## 2021-04-04 DIAGNOSIS — E1165 Type 2 diabetes mellitus with hyperglycemia: Secondary | ICD-10-CM | POA: Diagnosis not present

## 2021-05-26 DIAGNOSIS — H52223 Regular astigmatism, bilateral: Secondary | ICD-10-CM | POA: Diagnosis not present

## 2021-05-26 DIAGNOSIS — E1165 Type 2 diabetes mellitus with hyperglycemia: Secondary | ICD-10-CM | POA: Diagnosis not present

## 2021-05-26 DIAGNOSIS — Z0001 Encounter for general adult medical examination with abnormal findings: Secondary | ICD-10-CM | POA: Diagnosis not present

## 2021-05-26 DIAGNOSIS — Z136 Encounter for screening for cardiovascular disorders: Secondary | ICD-10-CM | POA: Diagnosis not present

## 2021-05-26 DIAGNOSIS — Z961 Presence of intraocular lens: Secondary | ICD-10-CM | POA: Diagnosis not present

## 2021-05-26 DIAGNOSIS — J4530 Mild persistent asthma, uncomplicated: Secondary | ICD-10-CM | POA: Diagnosis not present

## 2021-05-26 DIAGNOSIS — H524 Presbyopia: Secondary | ICD-10-CM | POA: Diagnosis not present

## 2021-05-26 DIAGNOSIS — E559 Vitamin D deficiency, unspecified: Secondary | ICD-10-CM | POA: Diagnosis not present

## 2021-05-26 DIAGNOSIS — J302 Other seasonal allergic rhinitis: Secondary | ICD-10-CM | POA: Diagnosis not present

## 2021-05-26 DIAGNOSIS — Z131 Encounter for screening for diabetes mellitus: Secondary | ICD-10-CM | POA: Diagnosis not present

## 2021-05-26 DIAGNOSIS — Z1329 Encounter for screening for other suspected endocrine disorder: Secondary | ICD-10-CM | POA: Diagnosis not present

## 2021-05-26 DIAGNOSIS — E039 Hypothyroidism, unspecified: Secondary | ICD-10-CM | POA: Diagnosis not present

## 2021-05-26 DIAGNOSIS — E7800 Pure hypercholesterolemia, unspecified: Secondary | ICD-10-CM | POA: Diagnosis not present

## 2021-06-03 DIAGNOSIS — Z1231 Encounter for screening mammogram for malignant neoplasm of breast: Secondary | ICD-10-CM | POA: Diagnosis not present

## 2021-06-23 DIAGNOSIS — E7800 Pure hypercholesterolemia, unspecified: Secondary | ICD-10-CM | POA: Diagnosis not present

## 2021-06-23 DIAGNOSIS — E039 Hypothyroidism, unspecified: Secondary | ICD-10-CM | POA: Diagnosis not present

## 2021-06-23 DIAGNOSIS — J4530 Mild persistent asthma, uncomplicated: Secondary | ICD-10-CM | POA: Diagnosis not present

## 2021-07-13 DIAGNOSIS — E039 Hypothyroidism, unspecified: Secondary | ICD-10-CM | POA: Diagnosis not present

## 2021-07-13 DIAGNOSIS — E669 Obesity, unspecified: Secondary | ICD-10-CM | POA: Diagnosis not present

## 2021-07-13 DIAGNOSIS — R5383 Other fatigue: Secondary | ICD-10-CM | POA: Diagnosis not present

## 2021-07-13 DIAGNOSIS — J4530 Mild persistent asthma, uncomplicated: Secondary | ICD-10-CM | POA: Diagnosis not present

## 2021-07-13 DIAGNOSIS — E7800 Pure hypercholesterolemia, unspecified: Secondary | ICD-10-CM | POA: Diagnosis not present

## 2021-07-13 DIAGNOSIS — E1165 Type 2 diabetes mellitus with hyperglycemia: Secondary | ICD-10-CM | POA: Diagnosis not present

## 2021-08-03 DIAGNOSIS — R5383 Other fatigue: Secondary | ICD-10-CM | POA: Diagnosis not present

## 2021-08-03 DIAGNOSIS — E039 Hypothyroidism, unspecified: Secondary | ICD-10-CM | POA: Diagnosis not present

## 2021-08-03 DIAGNOSIS — E1165 Type 2 diabetes mellitus with hyperglycemia: Secondary | ICD-10-CM | POA: Diagnosis not present

## 2021-08-03 DIAGNOSIS — J4530 Mild persistent asthma, uncomplicated: Secondary | ICD-10-CM | POA: Diagnosis not present

## 2021-08-03 DIAGNOSIS — E7800 Pure hypercholesterolemia, unspecified: Secondary | ICD-10-CM | POA: Diagnosis not present

## 2021-09-14 DIAGNOSIS — Y842 Radiological procedure and radiotherapy as the cause of abnormal reaction of the patient, or of later complication, without mention of misadventure at the time of the procedure: Secondary | ICD-10-CM | POA: Diagnosis not present

## 2021-09-14 DIAGNOSIS — E785 Hyperlipidemia, unspecified: Secondary | ICD-10-CM | POA: Diagnosis not present

## 2021-09-14 DIAGNOSIS — Z833 Family history of diabetes mellitus: Secondary | ICD-10-CM | POA: Diagnosis not present

## 2021-09-14 DIAGNOSIS — M1990 Unspecified osteoarthritis, unspecified site: Secondary | ICD-10-CM | POA: Diagnosis not present

## 2021-09-14 DIAGNOSIS — E669 Obesity, unspecified: Secondary | ICD-10-CM | POA: Diagnosis not present

## 2021-09-14 DIAGNOSIS — Z809 Family history of malignant neoplasm, unspecified: Secondary | ICD-10-CM | POA: Diagnosis not present

## 2021-09-14 DIAGNOSIS — Z6834 Body mass index (BMI) 34.0-34.9, adult: Secondary | ICD-10-CM | POA: Diagnosis not present

## 2021-09-14 DIAGNOSIS — E039 Hypothyroidism, unspecified: Secondary | ICD-10-CM | POA: Diagnosis not present

## 2021-09-14 DIAGNOSIS — R030 Elevated blood-pressure reading, without diagnosis of hypertension: Secondary | ICD-10-CM | POA: Diagnosis not present

## 2021-09-14 DIAGNOSIS — J42 Unspecified chronic bronchitis: Secondary | ICD-10-CM | POA: Diagnosis not present

## 2021-09-14 DIAGNOSIS — D84822 Immunodeficiency due to external causes: Secondary | ICD-10-CM | POA: Diagnosis not present

## 2021-09-14 DIAGNOSIS — Z818 Family history of other mental and behavioral disorders: Secondary | ICD-10-CM | POA: Diagnosis not present

## 2021-10-03 DIAGNOSIS — E7800 Pure hypercholesterolemia, unspecified: Secondary | ICD-10-CM | POA: Diagnosis not present

## 2021-10-03 DIAGNOSIS — Z0001 Encounter for general adult medical examination with abnormal findings: Secondary | ICD-10-CM | POA: Diagnosis not present

## 2021-10-03 DIAGNOSIS — E039 Hypothyroidism, unspecified: Secondary | ICD-10-CM | POA: Diagnosis not present

## 2021-10-03 DIAGNOSIS — E1165 Type 2 diabetes mellitus with hyperglycemia: Secondary | ICD-10-CM | POA: Diagnosis not present

## 2021-10-03 DIAGNOSIS — J4530 Mild persistent asthma, uncomplicated: Secondary | ICD-10-CM | POA: Diagnosis not present

## 2021-10-26 DIAGNOSIS — E7800 Pure hypercholesterolemia, unspecified: Secondary | ICD-10-CM | POA: Diagnosis not present

## 2021-10-26 DIAGNOSIS — E039 Hypothyroidism, unspecified: Secondary | ICD-10-CM | POA: Diagnosis not present

## 2021-10-26 DIAGNOSIS — J4530 Mild persistent asthma, uncomplicated: Secondary | ICD-10-CM | POA: Diagnosis not present

## 2021-10-26 DIAGNOSIS — R5383 Other fatigue: Secondary | ICD-10-CM | POA: Diagnosis not present

## 2021-10-26 DIAGNOSIS — E1165 Type 2 diabetes mellitus with hyperglycemia: Secondary | ICD-10-CM | POA: Diagnosis not present

## 2021-10-31 DIAGNOSIS — J4540 Moderate persistent asthma, uncomplicated: Secondary | ICD-10-CM | POA: Diagnosis not present

## 2021-12-12 DIAGNOSIS — J22 Unspecified acute lower respiratory infection: Secondary | ICD-10-CM | POA: Diagnosis not present

## 2021-12-23 DIAGNOSIS — E7800 Pure hypercholesterolemia, unspecified: Secondary | ICD-10-CM | POA: Diagnosis not present

## 2021-12-23 DIAGNOSIS — E1165 Type 2 diabetes mellitus with hyperglycemia: Secondary | ICD-10-CM | POA: Diagnosis not present

## 2021-12-23 DIAGNOSIS — J4530 Mild persistent asthma, uncomplicated: Secondary | ICD-10-CM | POA: Diagnosis not present

## 2021-12-23 DIAGNOSIS — E039 Hypothyroidism, unspecified: Secondary | ICD-10-CM | POA: Diagnosis not present

## 2022-01-11 DIAGNOSIS — J4530 Mild persistent asthma, uncomplicated: Secondary | ICD-10-CM | POA: Diagnosis not present

## 2022-01-11 DIAGNOSIS — E039 Hypothyroidism, unspecified: Secondary | ICD-10-CM | POA: Diagnosis not present

## 2022-01-11 DIAGNOSIS — E7800 Pure hypercholesterolemia, unspecified: Secondary | ICD-10-CM | POA: Diagnosis not present

## 2022-01-11 DIAGNOSIS — E1165 Type 2 diabetes mellitus with hyperglycemia: Secondary | ICD-10-CM | POA: Diagnosis not present

## 2022-04-12 DIAGNOSIS — E039 Hypothyroidism, unspecified: Secondary | ICD-10-CM | POA: Diagnosis not present

## 2022-04-12 DIAGNOSIS — E7800 Pure hypercholesterolemia, unspecified: Secondary | ICD-10-CM | POA: Diagnosis not present

## 2022-04-12 DIAGNOSIS — Z0000 Encounter for general adult medical examination without abnormal findings: Secondary | ICD-10-CM | POA: Diagnosis not present

## 2022-04-12 DIAGNOSIS — J4530 Mild persistent asthma, uncomplicated: Secondary | ICD-10-CM | POA: Diagnosis not present

## 2022-04-12 DIAGNOSIS — E1165 Type 2 diabetes mellitus with hyperglycemia: Secondary | ICD-10-CM | POA: Diagnosis not present

## 2022-06-21 DIAGNOSIS — E7800 Pure hypercholesterolemia, unspecified: Secondary | ICD-10-CM | POA: Diagnosis not present

## 2022-06-21 DIAGNOSIS — E1165 Type 2 diabetes mellitus with hyperglycemia: Secondary | ICD-10-CM | POA: Diagnosis not present

## 2022-06-21 DIAGNOSIS — J0100 Acute maxillary sinusitis, unspecified: Secondary | ICD-10-CM | POA: Diagnosis not present

## 2022-06-21 DIAGNOSIS — E039 Hypothyroidism, unspecified: Secondary | ICD-10-CM | POA: Diagnosis not present

## 2022-06-21 DIAGNOSIS — J4530 Mild persistent asthma, uncomplicated: Secondary | ICD-10-CM | POA: Diagnosis not present

## 2022-06-29 DIAGNOSIS — H60391 Other infective otitis externa, right ear: Secondary | ICD-10-CM | POA: Diagnosis not present

## 2022-06-29 DIAGNOSIS — J0110 Acute frontal sinusitis, unspecified: Secondary | ICD-10-CM | POA: Diagnosis not present

## 2022-07-04 DIAGNOSIS — M859 Disorder of bone density and structure, unspecified: Secondary | ICD-10-CM | POA: Diagnosis not present

## 2022-07-04 DIAGNOSIS — R92313 Mammographic fatty tissue density, bilateral breasts: Secondary | ICD-10-CM | POA: Diagnosis not present

## 2022-07-04 DIAGNOSIS — Z1231 Encounter for screening mammogram for malignant neoplasm of breast: Secondary | ICD-10-CM | POA: Diagnosis not present

## 2022-07-04 DIAGNOSIS — Z1239 Encounter for other screening for malignant neoplasm of breast: Secondary | ICD-10-CM | POA: Diagnosis not present

## 2022-07-12 DIAGNOSIS — H60391 Other infective otitis externa, right ear: Secondary | ICD-10-CM | POA: Diagnosis not present

## 2022-08-08 DIAGNOSIS — H524 Presbyopia: Secondary | ICD-10-CM | POA: Diagnosis not present

## 2022-08-08 DIAGNOSIS — H52223 Regular astigmatism, bilateral: Secondary | ICD-10-CM | POA: Diagnosis not present

## 2022-08-16 DIAGNOSIS — E039 Hypothyroidism, unspecified: Secondary | ICD-10-CM | POA: Diagnosis not present

## 2022-08-16 DIAGNOSIS — E1165 Type 2 diabetes mellitus with hyperglycemia: Secondary | ICD-10-CM | POA: Diagnosis not present

## 2022-08-16 DIAGNOSIS — E7800 Pure hypercholesterolemia, unspecified: Secondary | ICD-10-CM | POA: Diagnosis not present

## 2022-08-16 DIAGNOSIS — J4530 Mild persistent asthma, uncomplicated: Secondary | ICD-10-CM | POA: Diagnosis not present

## 2022-08-24 DIAGNOSIS — J0110 Acute frontal sinusitis, unspecified: Secondary | ICD-10-CM | POA: Diagnosis not present

## 2022-08-24 DIAGNOSIS — H9202 Otalgia, left ear: Secondary | ICD-10-CM | POA: Diagnosis not present

## 2022-10-05 DIAGNOSIS — Z0001 Encounter for general adult medical examination with abnormal findings: Secondary | ICD-10-CM | POA: Diagnosis not present

## 2022-10-05 DIAGNOSIS — J4530 Mild persistent asthma, uncomplicated: Secondary | ICD-10-CM | POA: Diagnosis not present

## 2022-10-05 DIAGNOSIS — E039 Hypothyroidism, unspecified: Secondary | ICD-10-CM | POA: Diagnosis not present

## 2022-10-05 DIAGNOSIS — E1165 Type 2 diabetes mellitus with hyperglycemia: Secondary | ICD-10-CM | POA: Diagnosis not present

## 2022-10-05 DIAGNOSIS — E7800 Pure hypercholesterolemia, unspecified: Secondary | ICD-10-CM | POA: Diagnosis not present

## 2022-11-09 DIAGNOSIS — J4531 Mild persistent asthma with (acute) exacerbation: Secondary | ICD-10-CM | POA: Diagnosis not present

## 2022-11-09 DIAGNOSIS — J209 Acute bronchitis, unspecified: Secondary | ICD-10-CM | POA: Diagnosis not present

## 2022-11-27 DIAGNOSIS — J22 Unspecified acute lower respiratory infection: Secondary | ICD-10-CM | POA: Diagnosis not present

## 2022-12-04 DIAGNOSIS — J22 Unspecified acute lower respiratory infection: Secondary | ICD-10-CM | POA: Diagnosis not present

## 2022-12-21 DIAGNOSIS — J301 Allergic rhinitis due to pollen: Secondary | ICD-10-CM | POA: Diagnosis not present

## 2022-12-21 DIAGNOSIS — J4541 Moderate persistent asthma with (acute) exacerbation: Secondary | ICD-10-CM | POA: Diagnosis not present

## 2022-12-21 DIAGNOSIS — J0111 Acute recurrent frontal sinusitis: Secondary | ICD-10-CM | POA: Diagnosis not present

## 2023-03-19 DIAGNOSIS — E039 Hypothyroidism, unspecified: Secondary | ICD-10-CM | POA: Diagnosis not present

## 2023-03-19 DIAGNOSIS — J4530 Mild persistent asthma, uncomplicated: Secondary | ICD-10-CM | POA: Diagnosis not present

## 2023-03-19 DIAGNOSIS — E1165 Type 2 diabetes mellitus with hyperglycemia: Secondary | ICD-10-CM | POA: Diagnosis not present

## 2023-03-19 DIAGNOSIS — E7800 Pure hypercholesterolemia, unspecified: Secondary | ICD-10-CM | POA: Diagnosis not present

## 2023-03-19 DIAGNOSIS — J4541 Moderate persistent asthma with (acute) exacerbation: Secondary | ICD-10-CM | POA: Diagnosis not present

## 2023-03-19 DIAGNOSIS — Z0001 Encounter for general adult medical examination with abnormal findings: Secondary | ICD-10-CM | POA: Diagnosis not present

## 2023-04-23 DIAGNOSIS — E7800 Pure hypercholesterolemia, unspecified: Secondary | ICD-10-CM | POA: Diagnosis not present

## 2023-04-23 DIAGNOSIS — J4541 Moderate persistent asthma with (acute) exacerbation: Secondary | ICD-10-CM | POA: Diagnosis not present

## 2023-04-23 DIAGNOSIS — J4530 Mild persistent asthma, uncomplicated: Secondary | ICD-10-CM | POA: Diagnosis not present

## 2023-04-23 DIAGNOSIS — E1165 Type 2 diabetes mellitus with hyperglycemia: Secondary | ICD-10-CM | POA: Diagnosis not present

## 2023-04-23 DIAGNOSIS — E039 Hypothyroidism, unspecified: Secondary | ICD-10-CM | POA: Diagnosis not present

## 2023-06-20 DIAGNOSIS — M545 Low back pain, unspecified: Secondary | ICD-10-CM | POA: Diagnosis not present

## 2023-06-20 DIAGNOSIS — M5136 Other intervertebral disc degeneration, lumbar region with discogenic back pain only: Secondary | ICD-10-CM | POA: Diagnosis not present

## 2023-07-23 DIAGNOSIS — E1165 Type 2 diabetes mellitus with hyperglycemia: Secondary | ICD-10-CM | POA: Diagnosis not present

## 2023-07-23 DIAGNOSIS — E039 Hypothyroidism, unspecified: Secondary | ICD-10-CM | POA: Diagnosis not present

## 2023-07-23 DIAGNOSIS — E7800 Pure hypercholesterolemia, unspecified: Secondary | ICD-10-CM | POA: Diagnosis not present

## 2023-07-23 DIAGNOSIS — J4530 Mild persistent asthma, uncomplicated: Secondary | ICD-10-CM | POA: Diagnosis not present

## 2023-07-23 DIAGNOSIS — J4541 Moderate persistent asthma with (acute) exacerbation: Secondary | ICD-10-CM | POA: Diagnosis not present

## 2023-07-23 DIAGNOSIS — M5136 Other intervertebral disc degeneration, lumbar region with discogenic back pain only: Secondary | ICD-10-CM | POA: Diagnosis not present

## 2023-09-12 DIAGNOSIS — E7800 Pure hypercholesterolemia, unspecified: Secondary | ICD-10-CM | POA: Diagnosis not present

## 2023-09-12 DIAGNOSIS — E1165 Type 2 diabetes mellitus with hyperglycemia: Secondary | ICD-10-CM | POA: Diagnosis not present

## 2023-09-12 DIAGNOSIS — J4541 Moderate persistent asthma with (acute) exacerbation: Secondary | ICD-10-CM | POA: Diagnosis not present

## 2023-09-12 DIAGNOSIS — E039 Hypothyroidism, unspecified: Secondary | ICD-10-CM | POA: Diagnosis not present

## 2023-09-12 DIAGNOSIS — M5136 Other intervertebral disc degeneration, lumbar region with discogenic back pain only: Secondary | ICD-10-CM | POA: Diagnosis not present

## 2023-09-12 DIAGNOSIS — J4530 Mild persistent asthma, uncomplicated: Secondary | ICD-10-CM | POA: Diagnosis not present

## 2023-09-21 DIAGNOSIS — M25561 Pain in right knee: Secondary | ICD-10-CM | POA: Diagnosis not present

## 2023-09-21 DIAGNOSIS — M1711 Unilateral primary osteoarthritis, right knee: Secondary | ICD-10-CM | POA: Diagnosis not present

## 2023-09-21 DIAGNOSIS — G8929 Other chronic pain: Secondary | ICD-10-CM | POA: Diagnosis not present

## 2023-10-01 DIAGNOSIS — M5136 Other intervertebral disc degeneration, lumbar region with discogenic back pain only: Secondary | ICD-10-CM | POA: Diagnosis not present

## 2023-10-01 DIAGNOSIS — E039 Hypothyroidism, unspecified: Secondary | ICD-10-CM | POA: Diagnosis not present

## 2023-10-01 DIAGNOSIS — E7800 Pure hypercholesterolemia, unspecified: Secondary | ICD-10-CM | POA: Diagnosis not present

## 2023-10-01 DIAGNOSIS — J4541 Moderate persistent asthma with (acute) exacerbation: Secondary | ICD-10-CM | POA: Diagnosis not present

## 2023-10-01 DIAGNOSIS — Z23 Encounter for immunization: Secondary | ICD-10-CM | POA: Diagnosis not present

## 2023-10-01 DIAGNOSIS — E1165 Type 2 diabetes mellitus with hyperglycemia: Secondary | ICD-10-CM | POA: Diagnosis not present

## 2023-10-01 DIAGNOSIS — Z0000 Encounter for general adult medical examination without abnormal findings: Secondary | ICD-10-CM | POA: Diagnosis not present

## 2023-10-19 DIAGNOSIS — R92323 Mammographic fibroglandular density, bilateral breasts: Secondary | ICD-10-CM | POA: Diagnosis not present

## 2023-10-19 DIAGNOSIS — Z1231 Encounter for screening mammogram for malignant neoplasm of breast: Secondary | ICD-10-CM | POA: Diagnosis not present

## 2023-10-24 DIAGNOSIS — E039 Hypothyroidism, unspecified: Secondary | ICD-10-CM | POA: Diagnosis not present
